# Patient Record
Sex: Female | Born: 1980 | Race: White | Hispanic: No | Marital: Married | Smoking: Never smoker
Health system: Southern US, Community
[De-identification: ages and names within clinical notes are randomized; demographics above are authoritative.]

## PROBLEM LIST (undated history)

## (undated) DIAGNOSIS — D649 Anemia, unspecified: Secondary | ICD-10-CM

## (undated) HISTORY — PX: WISDOM TOOTH EXTRACTION: SHX21

---

## 2019-06-15 ENCOUNTER — Ambulatory Visit: Payer: Self-pay

## 2019-06-15 NOTE — Telephone Encounter (Signed)
Patient called to cancel appointment.

## 2021-08-09 ENCOUNTER — Emergency Department
Admission: EM | Admit: 2021-08-09 | Discharge: 2021-08-09 | Disposition: A | Discharge status: Home / Self Care | Payer: BC Managed Care – PPO | Attending: Emergency Medicine | Admitting: Emergency Medicine

## 2021-08-09 ENCOUNTER — Emergency Department: Payer: BC Managed Care – PPO

## 2021-08-09 ENCOUNTER — Encounter: Payer: Self-pay | Admitting: Emergency Medicine

## 2021-08-09 ENCOUNTER — Other Ambulatory Visit: Payer: Self-pay

## 2021-08-09 DIAGNOSIS — S20211A Contusion of right front wall of thorax, initial encounter: Secondary | ICD-10-CM | POA: Diagnosis not present

## 2021-08-09 DIAGNOSIS — S8012XA Contusion of left lower leg, initial encounter: Secondary | ICD-10-CM | POA: Insufficient documentation

## 2021-08-09 DIAGNOSIS — S8011XA Contusion of right lower leg, initial encounter: Secondary | ICD-10-CM | POA: Insufficient documentation

## 2021-08-09 DIAGNOSIS — Y92410 Unspecified street and highway as the place of occurrence of the external cause: Secondary | ICD-10-CM | POA: Insufficient documentation

## 2021-08-09 DIAGNOSIS — S50311A Abrasion of right elbow, initial encounter: Secondary | ICD-10-CM | POA: Insufficient documentation

## 2021-08-09 DIAGNOSIS — S8991XA Unspecified injury of right lower leg, initial encounter: Secondary | ICD-10-CM | POA: Diagnosis present

## 2021-08-09 HISTORY — DX: Anemia, unspecified: D64.9

## 2021-08-09 MED ORDER — METHOCARBAMOL 500 MG PO TABS
1000.0000 mg | ORAL_TABLET | Freq: Once | ORAL | Status: AC
  Administered 2021-08-09: 1000 mg via ORAL
  Filled 2021-08-09: qty 2

## 2021-08-09 MED ORDER — MELOXICAM 7.5 MG PO TABS
15.0000 mg | ORAL_TABLET | Freq: Once | ORAL | Status: AC
  Administered 2021-08-09: 15 mg via ORAL
  Filled 2021-08-09: qty 2

## 2021-08-09 MED ORDER — METHOCARBAMOL 500 MG PO TABS: 500.0000 mg | ORAL_TABLET | Freq: Four times a day (QID) | ORAL | 0 refills | Status: AC

## 2021-08-09 MED ORDER — ONDANSETRON 8 MG PO TBDP
8.0000 mg | ORAL_TABLET | Freq: Once | ORAL | Status: AC
  Administered 2021-08-09: 8 mg via ORAL
  Filled 2021-08-09: qty 1

## 2021-08-09 MED ORDER — HYDROCODONE-ACETAMINOPHEN 5-325 MG PO TABS
1.0000 | ORAL_TABLET | Freq: Once | ORAL | Status: AC
  Administered 2021-08-09: 1 via ORAL
  Filled 2021-08-09: qty 1

## 2021-08-09 MED ORDER — MELOXICAM 15 MG PO TABS: 15.0000 mg | ORAL_TABLET | Freq: Every day | ORAL | 0 refills | Status: AC

## 2021-08-09 MED ORDER — HYDROCODONE-ACETAMINOPHEN 5-325 MG PO TABS: 1.0000 | ORAL_TABLET | ORAL | 0 refills | Status: AC | PRN
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
## 2021-08-09 NOTE — ED Notes (Signed)
Pt urine already sent to lab ?
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
## 2021-08-10 ENCOUNTER — Telehealth: Payer: Self-pay | Admitting: Emergency Medicine

## 2021-08-10 MED ORDER — OXYCODONE-ACETAMINOPHEN 5-325 MG PO TABS: 1.0000 | ORAL_TABLET | Freq: Four times a day (QID) | ORAL | 0 refills | Status: AC | PRN

## 2021-08-10 NOTE — Telephone Encounter (Cosign Needed)
[facility] called back for the patient.  They are of Norco.  They do have Roxicet 08/15/2023's and will prescribe same for the patient.  [facility] will cancel original [facility]co order. ?

## 2022-06-26 IMAGING — CR DG TIBIA/FIBULA 2V*L*
1 series · 3 of 3 positions shown · non-contrast
Comparison: None.

CLINICAL DATA: Pain, injury.

EXAM:
LEFT TIBIA AND FIBULA - 2 VIEW

[Series 1: dg tibia/fibula left · 0.14mm/px · 3 of 3 slices shown]
[im 1/3]
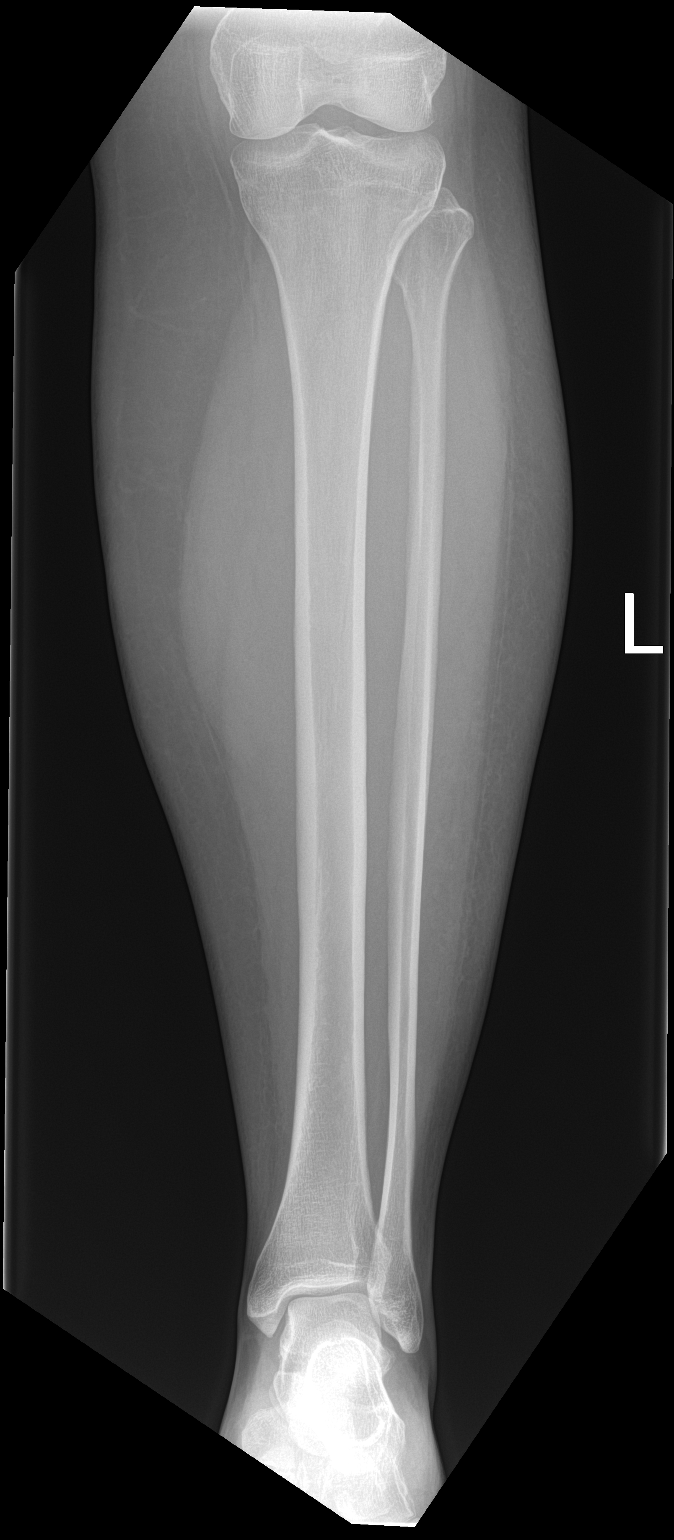
[im 2/3]
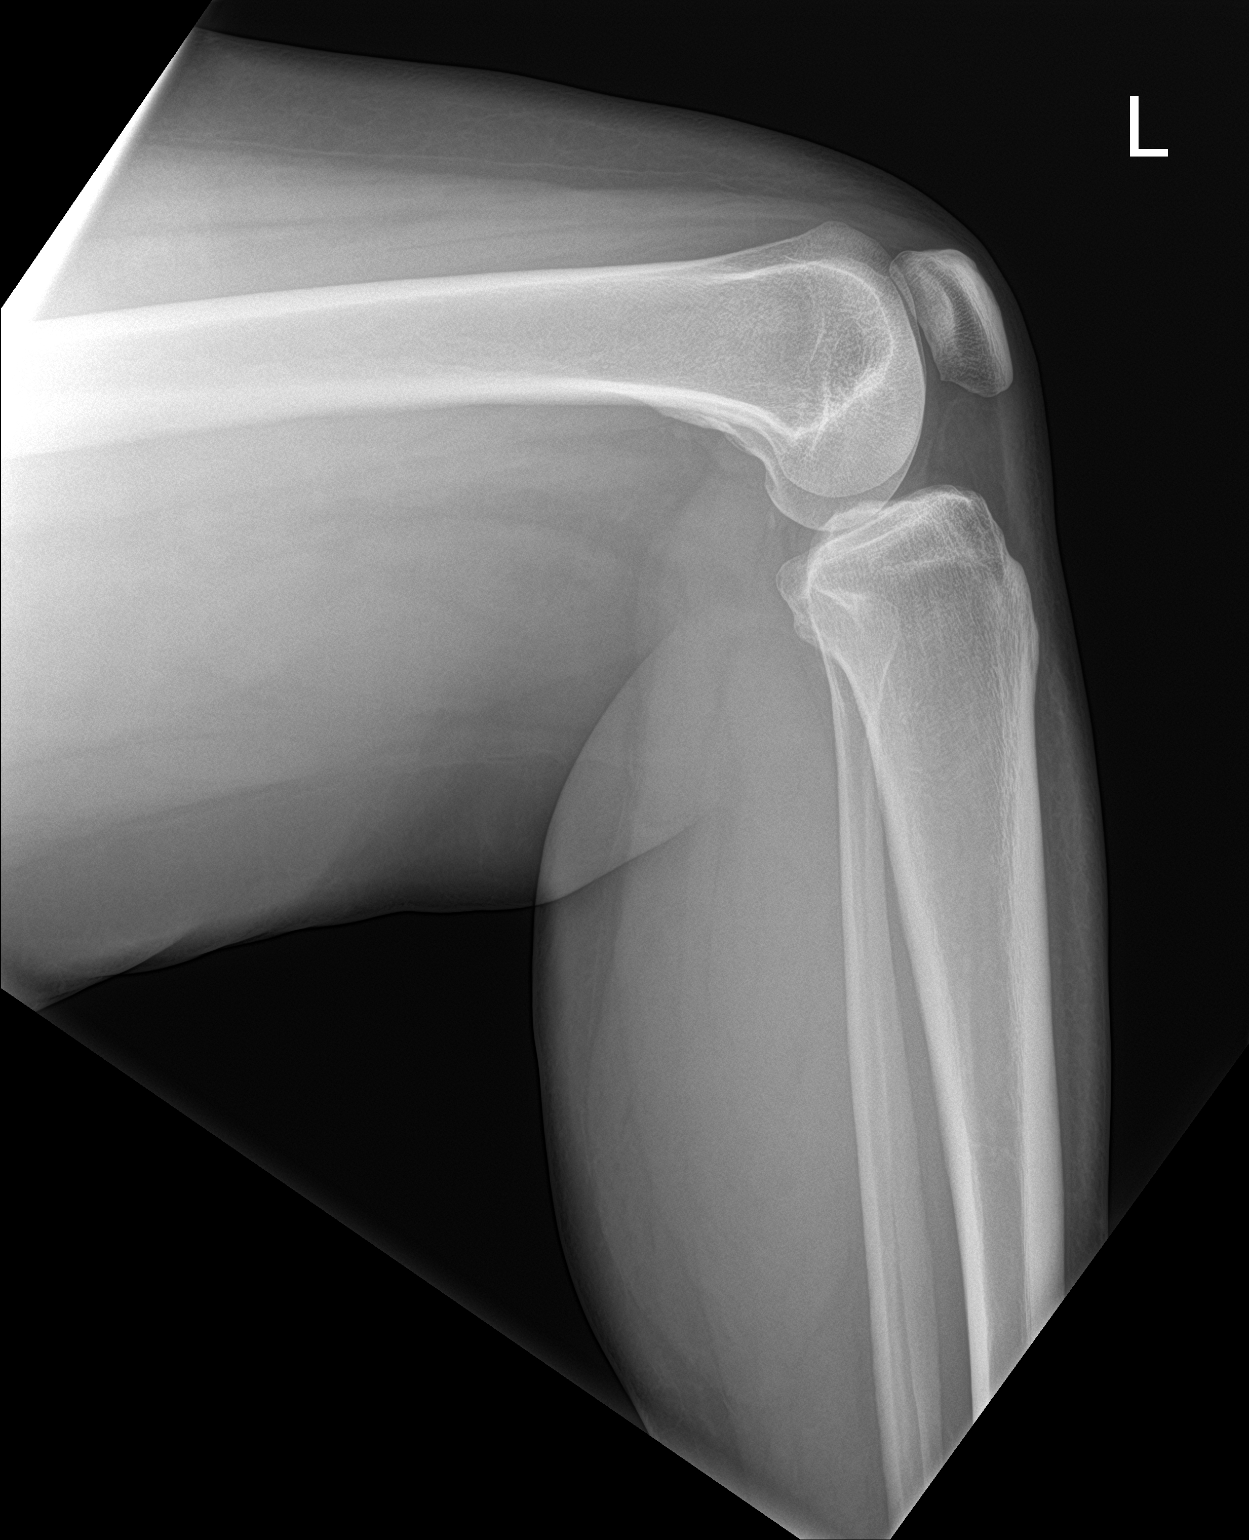
[im 3/3]
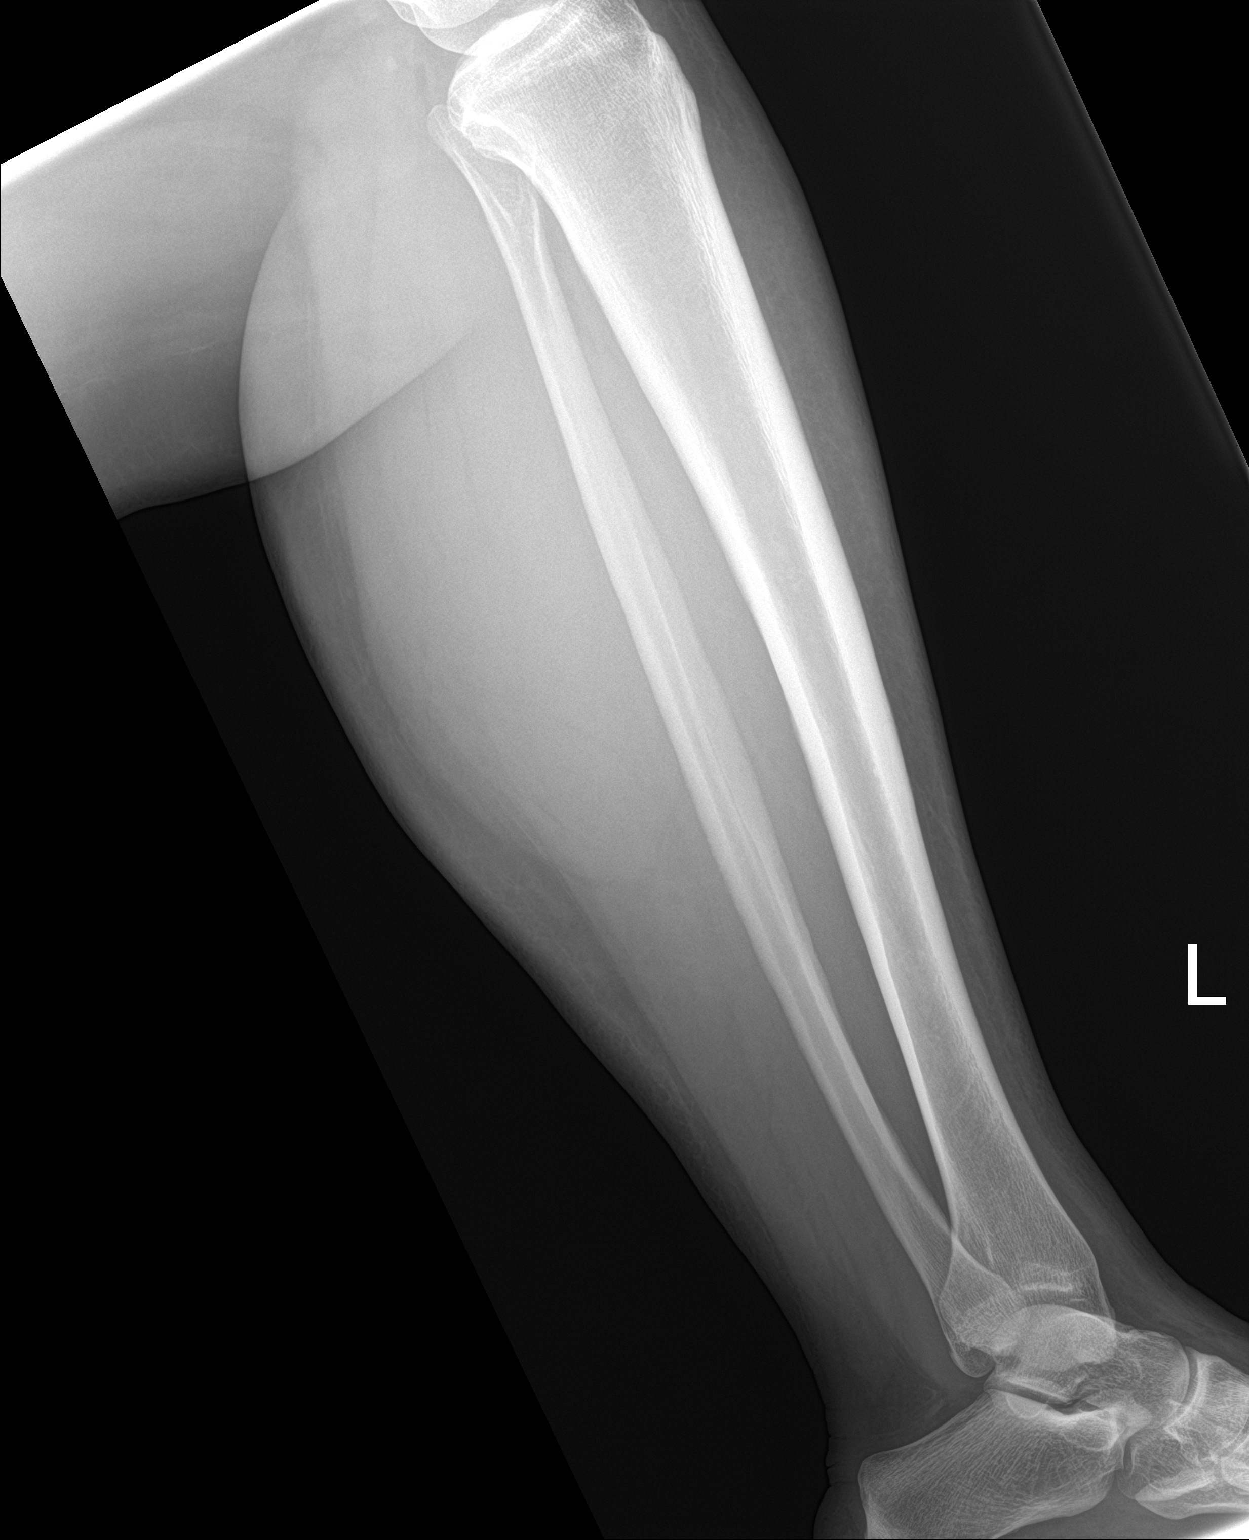

[3 of 3 positions shown; findings below may reference images not displayed]

FINDINGS: Cortical margins of the tibia and fibula are intact. There is no
evidence of fracture or other focal bone lesions. Knee and ankle
alignment are maintained. Mild anterior soft tissue edema.
IMPRESSION: Mild anterior soft tissue edema. No fracture.

## 2022-06-26 IMAGING — CR DG RIBS W/ CHEST 3+V*R*
1 series · 4 of 4 positions shown · non-contrast
Comparison: None.

CLINICAL DATA: Pain, injury.

EXAM:
RIGHT RIBS AND CHEST - 3+ VIEW

[Series 1: dg ribs unilateral w/chest right · 0.14mm/px · 4 of 4 slices shown]
[im 1/4]
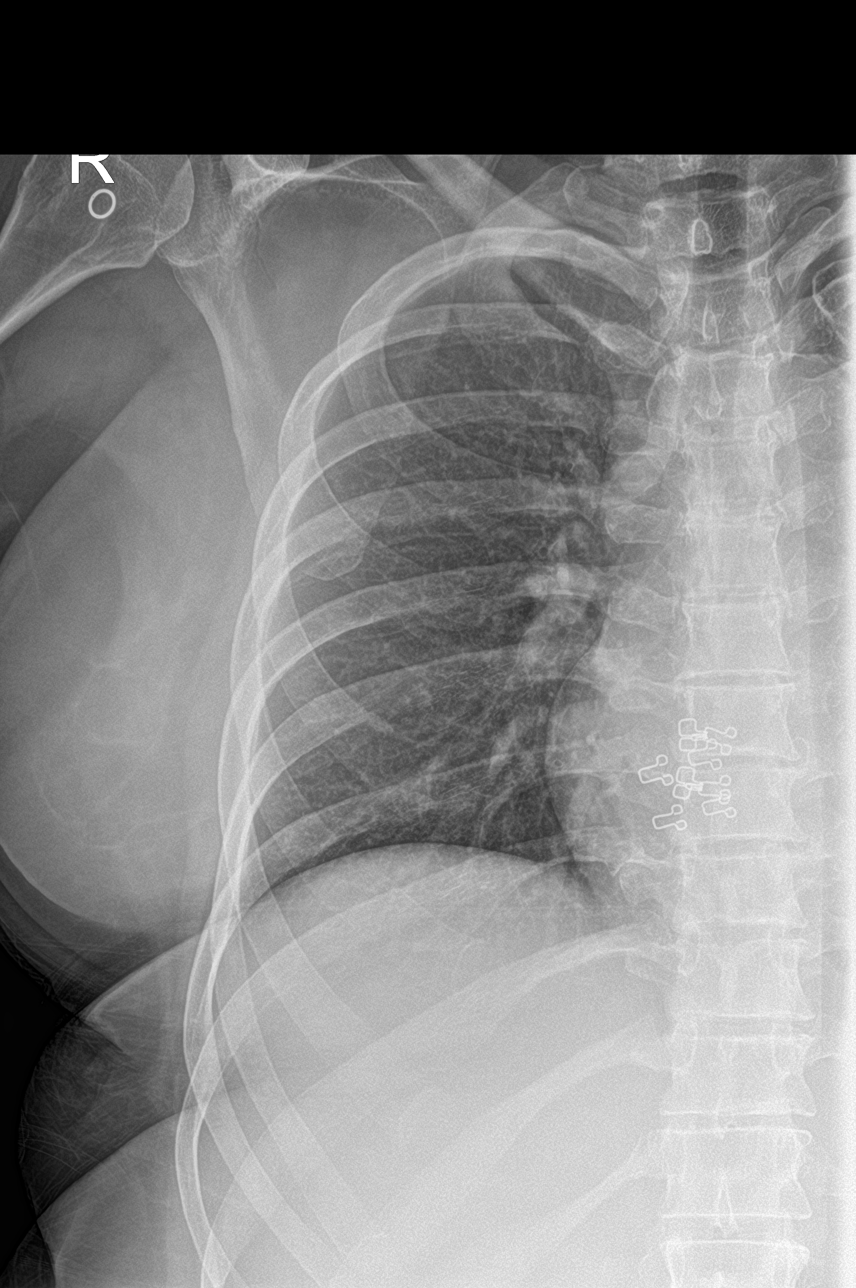
[im 2/4]
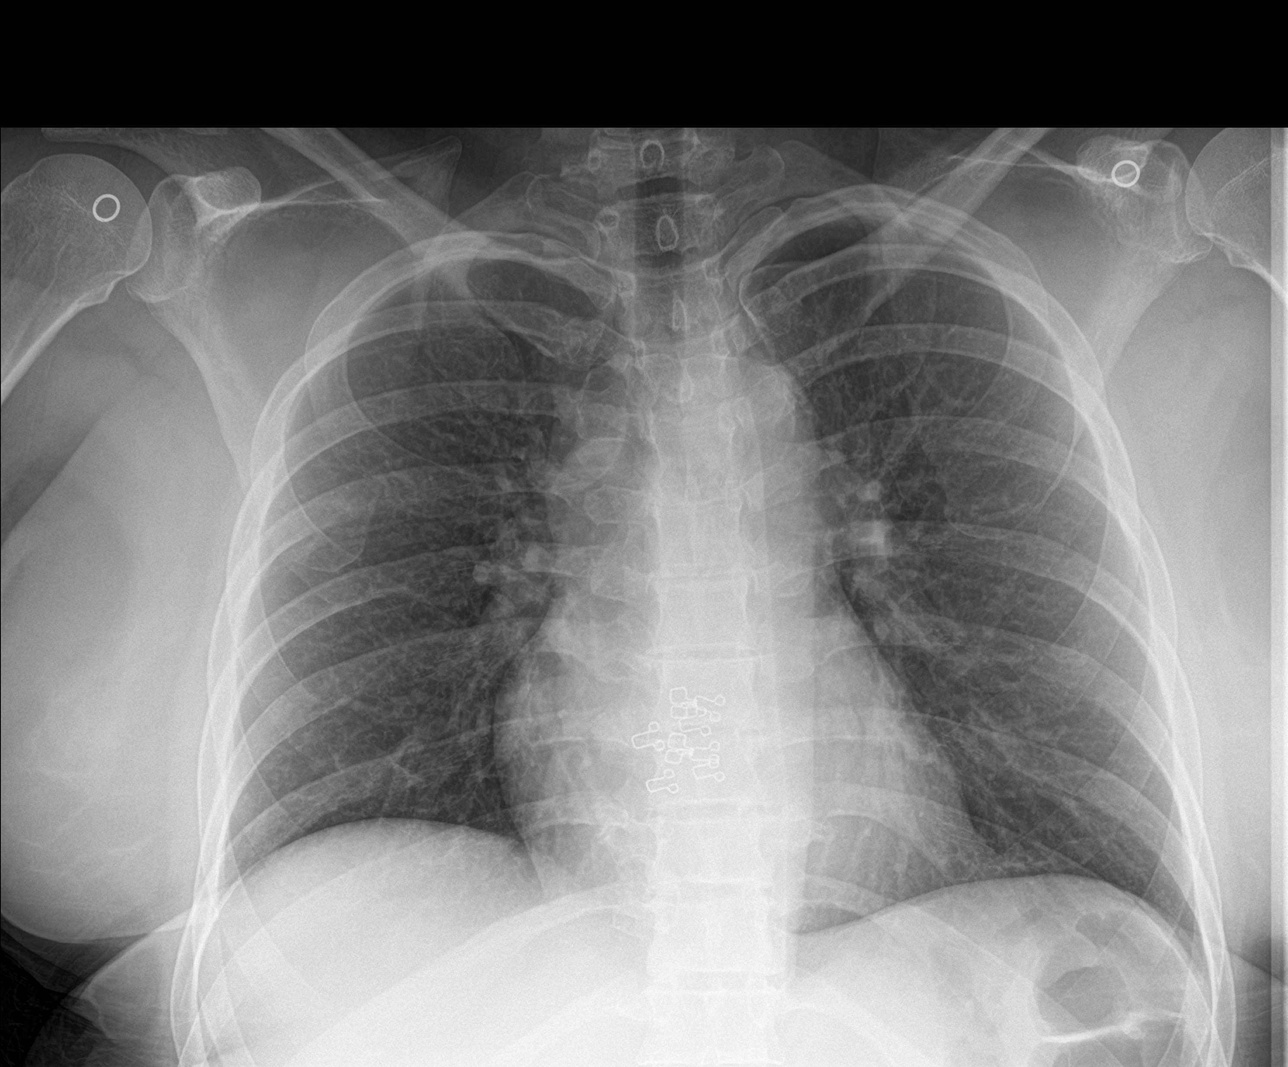
[im 3/4]
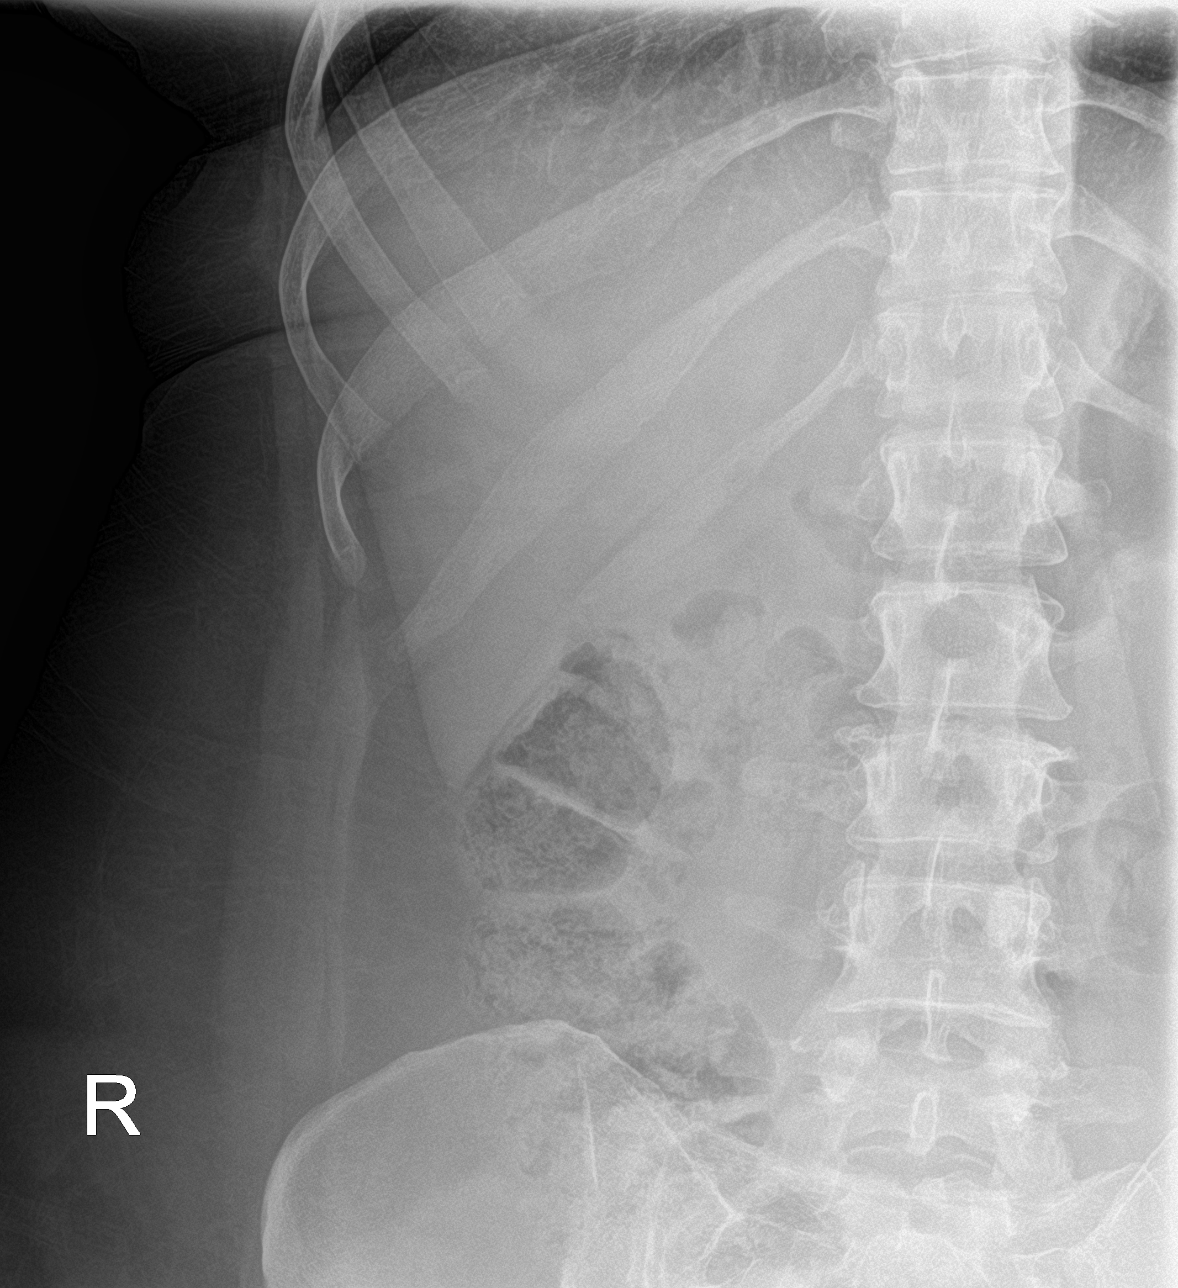
[im 4/4]
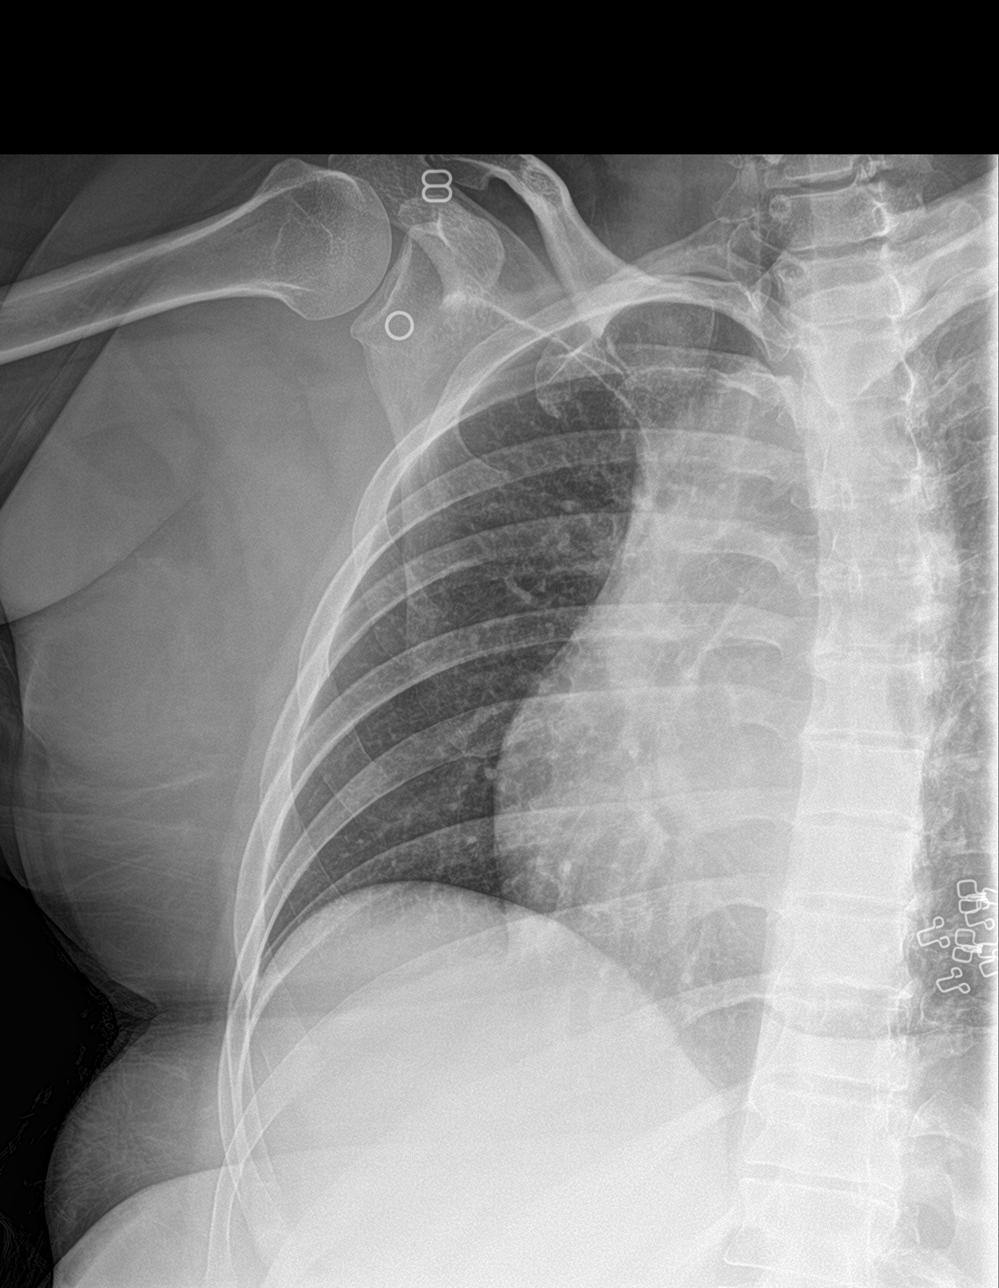

[4 of 4 positions shown; findings below may reference images not displayed]

FINDINGS: No fracture or other bone lesions are seen involving the ribs. There
is no evidence of pneumothorax or pleural effusion. Both lungs are
clear. Heart size and mediastinal contours are within normal limits.
IMPRESSION: Negative radiographs of the chest and right ribs.

## 2022-06-26 IMAGING — CR DG FOOT COMPLETE 3+V*L*
1 series · 3 of 3 positions shown · non-contrast
Comparison: None.

CLINICAL DATA: Pain, injury.

EXAM:
LEFT FOOT - COMPLETE 3+ VIEW

[Series 1: dg foot complete left · 0.14mm/px · 3 of 3 slices shown]
[im 1/3]
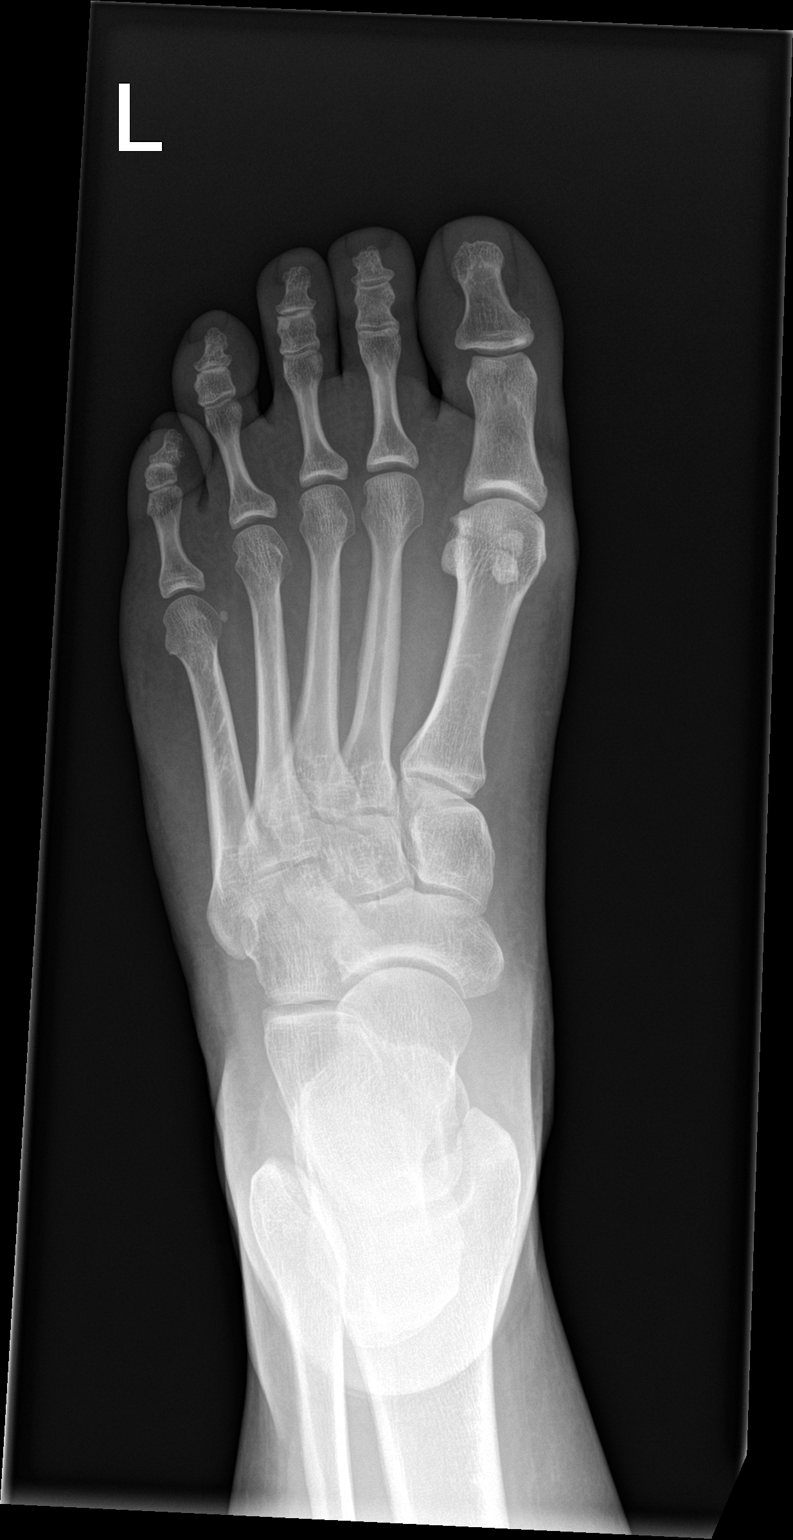
[im 2/3]
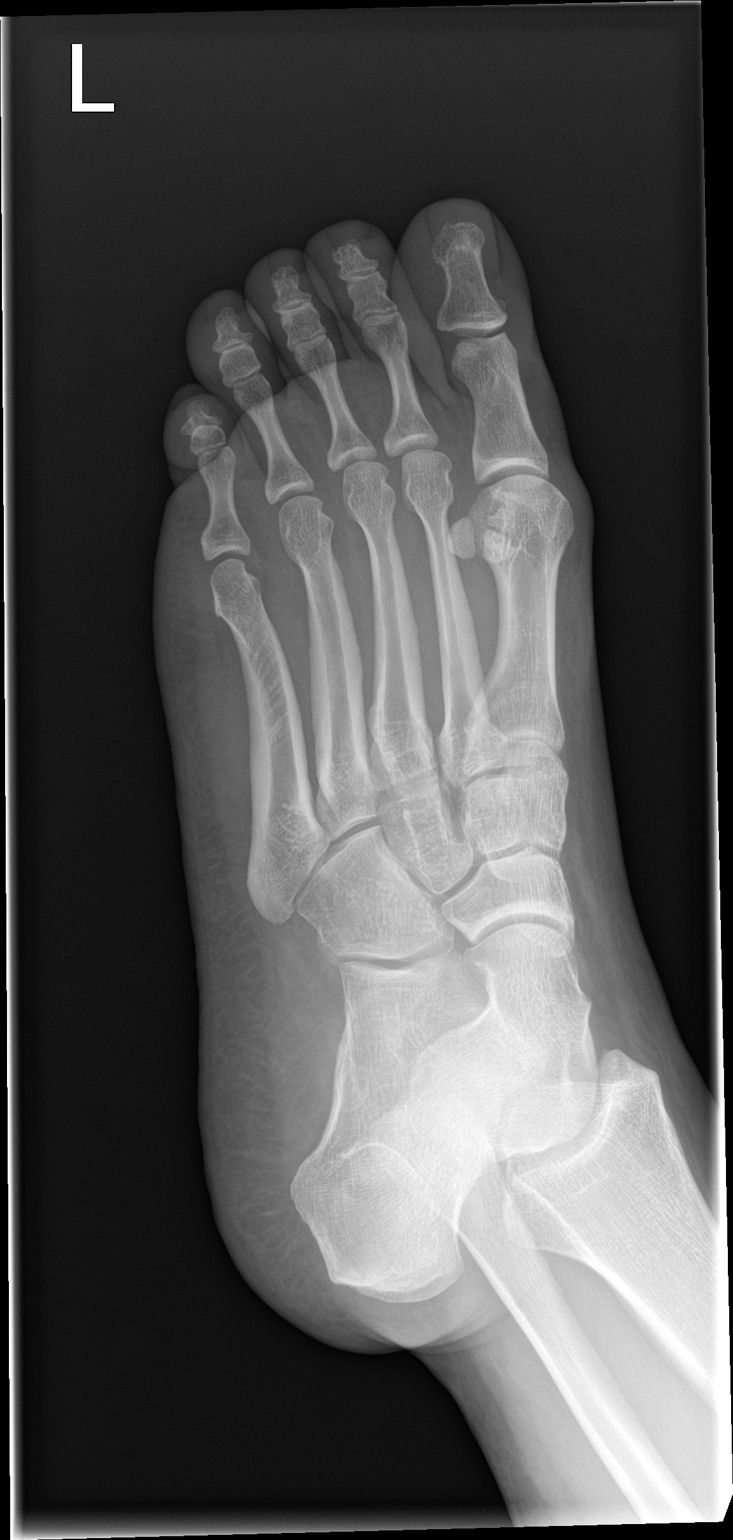
[im 3/3]
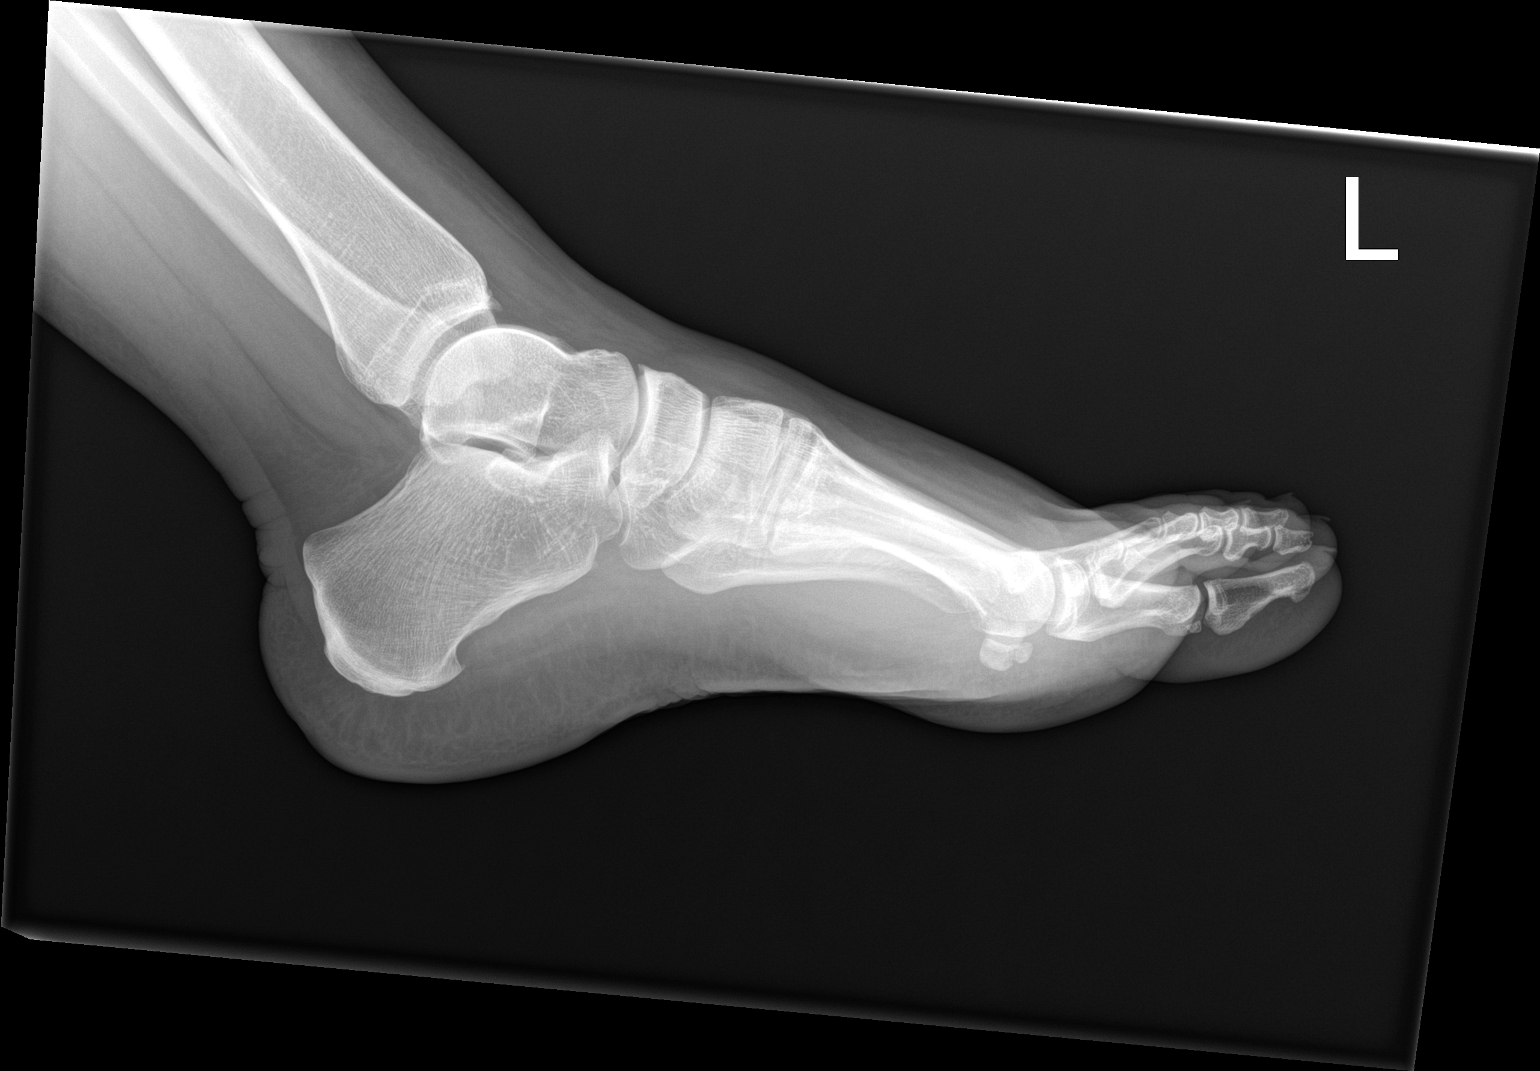

[3 of 3 positions shown; findings below may reference images not displayed]

FINDINGS: There is no evidence of fracture or dislocation. Normal joint
spaces. Normal alignment. There is no evidence of arthropathy or
other focal bone abnormality. Soft tissues are unremarkable.
IMPRESSION: No fracture or dislocation of the left foot.

## 2022-06-26 IMAGING — CR DG PELVIS 1-2V
1 series · 1 of 1 positions shown · non-contrast
Comparison: None.

CLINICAL DATA: Pain, injury.

EXAM:
PELVIS - 1-2 VIEW

[dg hip unilat w or w/o pelvis 2-3 views ]
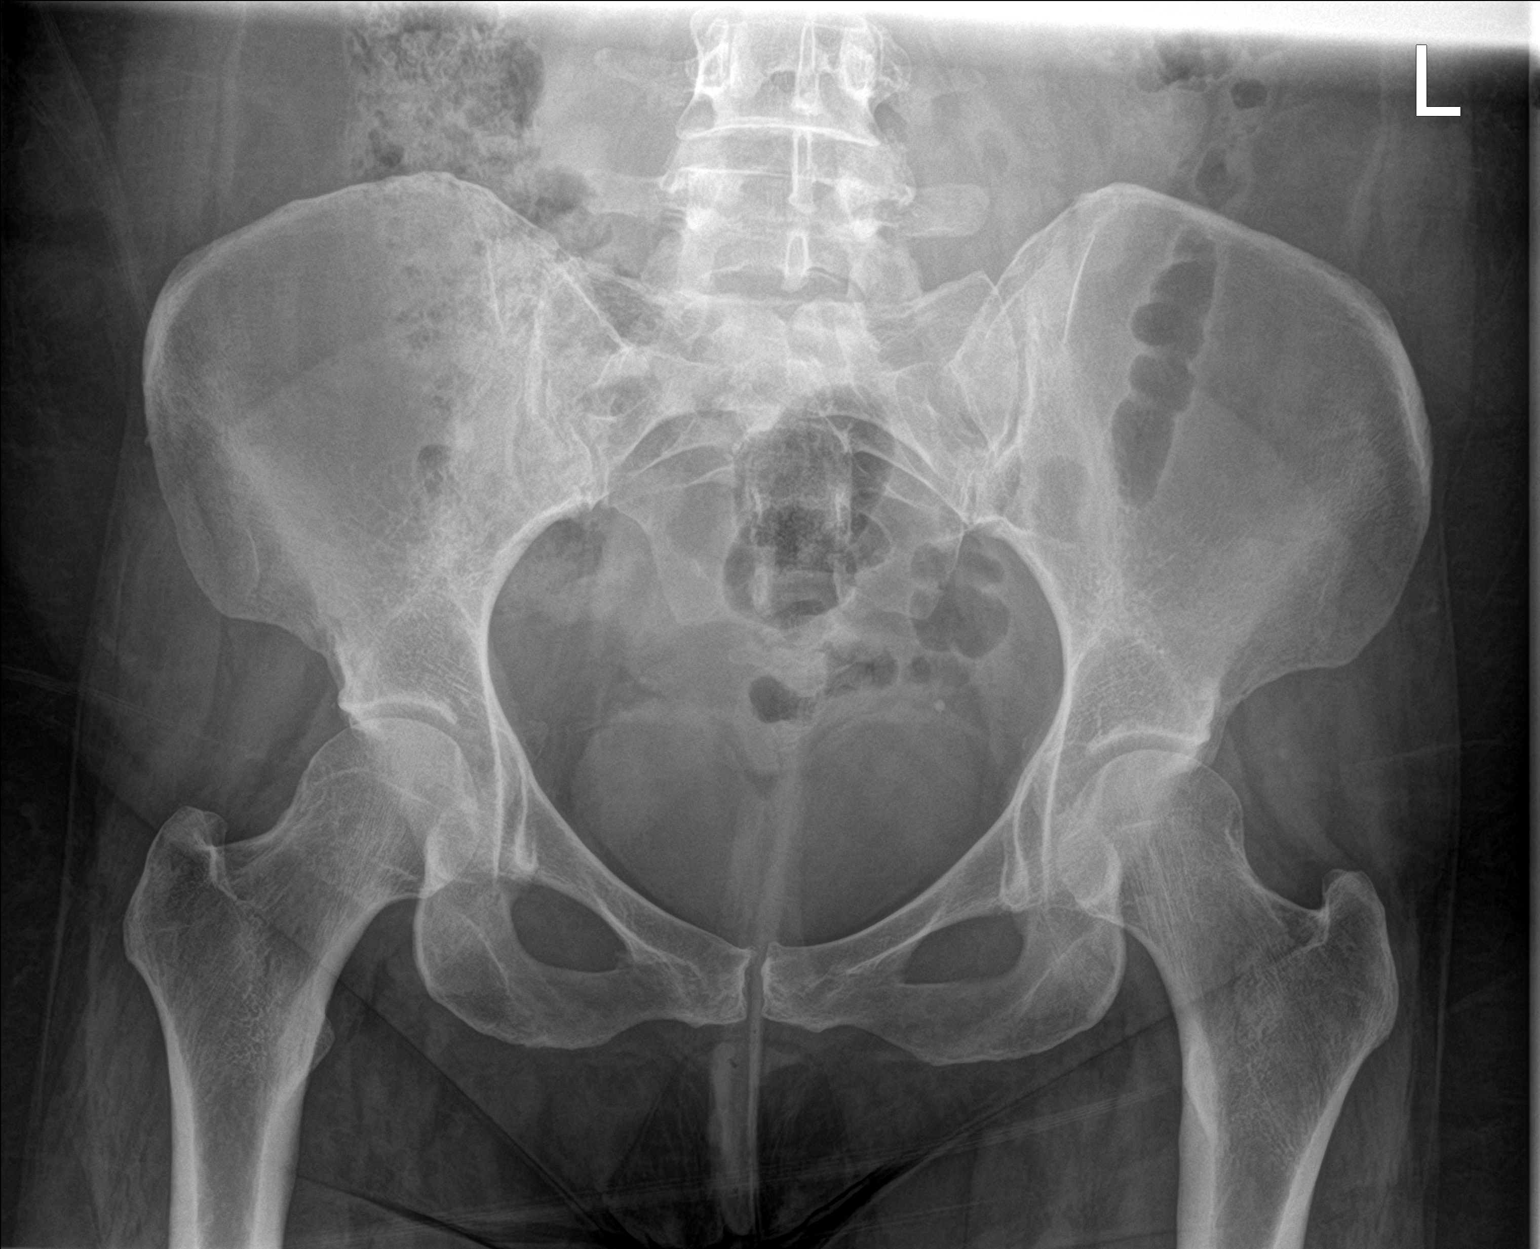

[1 of 1 positions shown; findings below may reference images not displayed]

FINDINGS: The cortical margins of the bony pelvis are intact. No fracture.
Pubic symphysis and sacroiliac joints are congruent. Both femoral
heads are well-seated in the respective acetabula.
IMPRESSION: No pelvic fracture.
